# Patient Record
Sex: Female | Born: 1940 | Race: White | Hispanic: No | Smoking: Former smoker
Health system: Southern US, Community
[De-identification: ages and names within clinical notes are randomized; demographics above are authoritative.]

## PROBLEM LIST (undated history)

## (undated) DIAGNOSIS — G4730 Sleep apnea, unspecified: Secondary | ICD-10-CM

## (undated) DIAGNOSIS — Z8489 Family history of other specified conditions: Secondary | ICD-10-CM

## (undated) DIAGNOSIS — J45909 Unspecified asthma, uncomplicated: Secondary | ICD-10-CM

## (undated) DIAGNOSIS — I10 Essential (primary) hypertension: Secondary | ICD-10-CM

## (undated) DIAGNOSIS — E039 Hypothyroidism, unspecified: Secondary | ICD-10-CM

## (undated) HISTORY — PX: APPENDECTOMY: SHX54

## (undated) HISTORY — PX: ABDOMINAL HYSTERECTOMY: SHX81

## (undated) HISTORY — PX: BREAST SURGERY: SHX581

## (undated) HISTORY — PX: CATARACT EXTRACTION, BILATERAL: SHX1313

---

## 2000-05-15 ENCOUNTER — Ambulatory Visit (HOSPITAL_COMMUNITY): Admission: RE | Admit: 2000-05-15 | Discharge: 2000-05-15 | Payer: Self-pay | Admitting: Neurosurgery

## 2000-05-15 ENCOUNTER — Encounter: Payer: Self-pay | Admitting: Neurosurgery

## 2000-06-04 ENCOUNTER — Ambulatory Visit (HOSPITAL_COMMUNITY): Admission: RE | Admit: 2000-06-04 | Discharge: 2000-06-04 | Payer: Self-pay

## 2000-06-04 ENCOUNTER — Encounter: Payer: Self-pay | Admitting: Neurosurgery

## 2000-06-26 ENCOUNTER — Ambulatory Visit (HOSPITAL_COMMUNITY): Admission: RE | Admit: 2000-06-26 | Discharge: 2000-06-26 | Payer: Self-pay | Admitting: Neurosurgery

## 2000-06-26 ENCOUNTER — Encounter: Payer: Self-pay | Admitting: Neurosurgery

## 2009-09-17 ENCOUNTER — Emergency Department: Payer: Self-pay | Admitting: Emergency Medicine

## 2014-01-28 ENCOUNTER — Ambulatory Visit: Payer: Self-pay | Admitting: Ophthalmology

## 2014-01-28 DIAGNOSIS — I10 Essential (primary) hypertension: Secondary | ICD-10-CM

## 2014-01-28 DIAGNOSIS — Z01810 Encounter for preprocedural cardiovascular examination: Secondary | ICD-10-CM

## 2014-01-28 LAB — POTASSIUM: Potassium: 4.2 mmol/L (ref 3.5–5.1)

## 2014-02-09 ENCOUNTER — Ambulatory Visit: Payer: Self-pay | Admitting: Ophthalmology

## 2014-02-19 ENCOUNTER — Ambulatory Visit: Payer: Self-pay | Admitting: Ophthalmology

## 2014-02-19 LAB — POTASSIUM: Potassium: 3.5 mmol/L (ref 3.5–5.1)

## 2014-03-09 ENCOUNTER — Ambulatory Visit: Payer: Self-pay | Admitting: Ophthalmology

## 2015-01-01 NOTE — Op Note (Signed)
PATIENT NAME:  Lydia Frey, Lydia Frey MR#:  [id number] DATE OF BIRTH:  29-Aug-1941  DATE OF PROCEDURE:  03/09/2014  PREOPERATIVE DIAGNOSIS: Visually significant cataract of the left eye.   POSTOPERATIVE DIAGNOSIS: Visually significant cataract of the left eye.   OPERATIVE PROCEDURE: Cataract extraction by phacoemulsification with implant of intraocular lens to left eye.   SURGEON: Galen ManilaWilliam Jaylenne Hamelin, MD.   ANESTHESIA:  1. Managed anesthesia care.  2. Topical tetracaine drops followed by 2% Xylocaine jelly applied in the preoperative holding area.   COMPLICATIONS: None.   TECHNIQUE:  Stop and chop.   DESCRIPTION OF PROCEDURE: The patient was examined and consented in the preoperative holding area where the aforementioned topical anesthesia was applied to the left eye and then brought back to the Operating Room where the left eye was prepped and draped in the usual sterile ophthalmic fashion and a lid speculum was placed. A paracentesis was created with the side port blade and the anterior chamber was filled with viscoelastic. A near clear corneal incision was performed with the steel keratome. A continuous curvilinear capsulorrhexis was performed with a cystotome followed by the capsulorrhexis forceps. Hydrodissection and hydrodelineation were carried out with BSS on a blunt cannula. The lens was removed in a stop and chop technique and the remaining cortical material was removed with the irrigation-aspiration handpiece. The capsular bag was inflated with viscoelastic and the Tecnis ZCB00 21.5-diopter lens, serial number [id number] was placed in the capsular bag without complication. The remaining viscoelastic was removed from the eye with the irrigation-aspiration handpiece. The wounds were hydrated. The anterior chamber was flushed with Miostat and the eye was inflated to physiologic pressure. 0.1 mL of cefuroxime concentration 10 mg/mL was placed in the anterior chamber. The wounds were found to be water  tight. The eye was dressed with Vigamox. The patient was given protective glasses to wear throughout the day and a shield with which to sleep tonight. The patient was also given drops with which to begin a drop regimen today and will follow-up with me in one day.    ___________________________ Jerilee FieldWilliam Frey. Karissa Meenan, MD wlp:lt D: 03/09/2014 14:24:41 ET T: 03/09/2014 21:47:20 ET JOB#: [id number]  cc: Amardeep Beckers Frey. Aryssa Rosamond, MD, <Dictator> Jerilee FieldWILLIAM Frey Shuntavia Yerby MD ELECTRONICALLY SIGNED 03/11/2014 10:48

## 2015-01-01 NOTE — Op Note (Signed)
PATIENT NAME:  Lydia DupesHINSHAW, [name] MR#:  [id number] DATE OF BIRTH:  Mar 19, 1941  DATE OF PROCEDURE:  02/09/2014  PREOPERATIVE DIAGNOSIS: Visually significant cataract of the right eye.   POSTOPERATIVE DIAGNOSIS: Visually significant cataract of the right eye.   OPERATIVE PROCEDURE: Cataract extraction by phacoemulsification with implant of intraocular lens to right eye.   SURGEON: Galen ManilaWilliam Mashanda Ishibashi, MD.   ANESTHESIA:  1.  Managed anesthesia care.  2.  Topical tetracaine drops followed by 2% Xylocaine jelly applied in the preoperative holding area.   COMPLICATIONS: None.   TECHNIQUE:  Stop and chop.  DESCRIPTION OF PROCEDURE: The patient was examined and consented in the preoperative holding area where the aforementioned topical anesthesia was applied to the right eye and then brought back to the Operating Room where the right eye was prepped and draped in the usual sterile ophthalmic fashion and a lid speculum was placed. A paracentesis was created with the side port blade and the anterior chamber was filled with viscoelastic. A near clear corneal incision was performed with the steel keratome. A continuous curvilinear capsulorrhexis was performed with a cystotome followed by the capsulorrhexis forceps. Hydrodissection and hydrodelineation were carried out with BSS on a blunt cannula. The lens was removed in a stop and chop technique and the remaining cortical material was removed with the irrigation-aspiration handpiece. The capsular bag was inflated with viscoelastic and the Tecnis ZCB00 21.5-diopter lens, serial number [id number] was placed in the capsular bag without complication. The remaining viscoelastic was removed from the eye with the irrigation-aspiration handpiece. The wounds were hydrated. The anterior chamber was flushed with Miostat and the eye was inflated to physiologic pressure. 0.1 mL of cefuroxime concentration 10 mg/mL was placed in the anterior chamber. The wounds were found to be  water tight. The eye was dressed with Vigamox. The patient was given protective glasses to wear throughout the day and a shield with which to sleep tonight. The patient was also given drops with which to begin a drop regimen today and will follow-up with me in one day.      ____________________________ Jerilee FieldWilliam L. Ruchi Stoney, MD wlp:dmm D: 02/09/2014 21:24:39 ET T: 02/09/2014 21:36:31 ET JOB#: [id number]  cc: Mitali Shenefield L. Chaden Doom, MD, <Dictator> Jerilee FieldWILLIAM L Jasey Cortez MD ELECTRONICALLY SIGNED 02/10/2014 13:44

## 2016-09-25 ENCOUNTER — Other Ambulatory Visit: Payer: Self-pay | Admitting: Specialist

## 2016-09-25 ENCOUNTER — Ambulatory Visit
Admission: RE | Admit: 2016-09-25 | Discharge: 2016-09-25 | Disposition: A | Discharge status: Home / Self Care | Payer: Medicare Other | Source: Ambulatory Visit | Attending: Specialist | Admitting: Specialist

## 2016-09-25 ENCOUNTER — Other Ambulatory Visit: Payer: Self-pay | Admitting: Sports Medicine

## 2016-09-25 DIAGNOSIS — M7989 Other specified soft tissue disorders: Secondary | ICD-10-CM

## 2016-09-25 DIAGNOSIS — M79605 Pain in left leg: Secondary | ICD-10-CM | POA: Diagnosis not present

## 2017-01-31 ENCOUNTER — Other Ambulatory Visit: Payer: Self-pay | Admitting: Specialist

## 2017-02-11 ENCOUNTER — Other Ambulatory Visit: Payer: Self-pay | Admitting: Specialist

## 2017-02-11 ENCOUNTER — Encounter
Admission: RE | Admit: 2017-02-11 | Discharge: 2017-02-11 | Disposition: A | Discharge status: Home / Self Care | Payer: Medicare Other | Source: Ambulatory Visit | Attending: Specialist | Admitting: Specialist

## 2017-02-11 DIAGNOSIS — I10 Essential (primary) hypertension: Secondary | ICD-10-CM | POA: Insufficient documentation

## 2017-02-11 DIAGNOSIS — Z01810 Encounter for preprocedural cardiovascular examination: Secondary | ICD-10-CM | POA: Diagnosis not present

## 2017-02-11 DIAGNOSIS — I4510 Unspecified right bundle-branch block: Secondary | ICD-10-CM | POA: Insufficient documentation

## 2017-02-11 HISTORY — DX: Essential (primary) hypertension: I10

## 2017-02-11 HISTORY — DX: Unspecified asthma, uncomplicated: J45.909

## 2017-02-11 HISTORY — DX: Hypothyroidism, unspecified: E03.9

## 2017-02-11 HISTORY — DX: Family history of other specified conditions: Z84.89

## 2017-02-11 HISTORY — DX: Sleep apnea, unspecified: G47.30

## 2017-02-11 NOTE — Pre-Procedure Instructions (Signed)
[facility]

## 2017-02-11 NOTE — Patient Instructions (Signed)
Your procedure is scheduled on: Monday 02/18/17 Report to DAY SURGERY. 2ND FLOOR MEDICAL MALL ENTRANCE. To find out your arrival time please call [phone] between 1PM - 3PM on Friday 02/15/17.  Remember: Instructions that are not followed completely may result in serious medical risk, up to and including death, or upon the discretion of your surgeon and anesthesiologist your surgery may need to be rescheduled.    __X__ 1. Do not eat food or drink liquids after midnight. No gum chewing or hard candies.     __X__ 2. No Alcohol for 24 hours before or after surgery.   ____ 3. Bring all medications with you on the day of surgery if instructed.    __X__ 4. Notify your doctor if there is any change in your medical condition     (cold, fever, infections).             ___X__5. No smoking within 24 hours of your surgery.     Do not wear jewelry, make-up, hairpins, clips or nail polish.  Do not wear lotions, powders, or perfumes.   Do not shave 48 hours prior to surgery. Men may shave face and neck.  Do not bring valuables to the hospital.    [facility] belongings or valuables.               Contacts, dentures or bridgework may not be worn into surgery.  Leave your suitcase in the car. After surgery it may be brought to your room.  For patients admitted to the hospital, discharge time is determined by your                treatment team.   Patients discharged the day of surgery will not be allowed to drive home.   Please read over the following fact sheets that you were given:   MRSA Information   __X__ Take these medicines the morning of surgery with A SIP OF WATER:    1. AMLODIPINE  2. LEVOTHYROXINE  3. LOSARTAN  4. METOPROLOL  5.  6.  ____ Fleet Enema (as directed)   __X__ Use CHG Soap as directed  __X__ Use YOUR NEBULIZER MORNING OF SURGERY AND BRING RESCUE INHALER  ____ Stop metformin 2 days prior to surgery    ____ Take 1/2 of usual insulin  dose the night before surgery and none on the morning of surgery.   ____ Stop Coumadin/Plavix/aspirin on   __X__ Stop Anti-inflammatories such as Advil, Aleve, Ibuprofen, Motrin, Naproxen, Naprosyn, Goodies,powder, or aspirin products.  OK to take Tylenol. STOP MELOXICAM   ____ Stop supplements until after surgery.    __X__ Bring C-Pap to the hospital.

## 2017-02-11 NOTE — Pre-Procedure Instructions (Signed)
Dr Penwarden reviewed EKG. No new orders. 

## 2017-02-18 ENCOUNTER — Ambulatory Visit: Payer: Medicare Other | Admitting: Anesthesiology

## 2017-02-18 ENCOUNTER — Encounter
Admission: RE | Disposition: A | Discharge status: Home / Self Care | Payer: Self-pay | Source: Ambulatory Visit | Attending: Specialist

## 2017-02-18 ENCOUNTER — Encounter: Payer: Self-pay | Admitting: *Deleted

## 2017-02-18 ENCOUNTER — Ambulatory Visit
Admission: RE | Admit: 2017-02-18 | Discharge: 2017-02-18 | Disposition: A | Discharge status: Home / Self Care | Payer: Medicare Other | Source: Ambulatory Visit | Attending: Specialist | Admitting: Specialist

## 2017-02-18 DIAGNOSIS — Z888 Allergy status to other drugs, medicaments and biological substances status: Secondary | ICD-10-CM | POA: Diagnosis not present

## 2017-02-18 DIAGNOSIS — E039 Hypothyroidism, unspecified: Secondary | ICD-10-CM | POA: Insufficient documentation

## 2017-02-18 DIAGNOSIS — G5601 Carpal tunnel syndrome, right upper limb: Secondary | ICD-10-CM | POA: Insufficient documentation

## 2017-02-18 DIAGNOSIS — I10 Essential (primary) hypertension: Secondary | ICD-10-CM | POA: Diagnosis not present

## 2017-02-18 DIAGNOSIS — Z9989 Dependence on other enabling machines and devices: Secondary | ICD-10-CM | POA: Insufficient documentation

## 2017-02-18 DIAGNOSIS — Z882 Allergy status to sulfonamides status: Secondary | ICD-10-CM | POA: Diagnosis not present

## 2017-02-18 DIAGNOSIS — Z87891 Personal history of nicotine dependence: Secondary | ICD-10-CM | POA: Diagnosis not present

## 2017-02-18 DIAGNOSIS — J45909 Unspecified asthma, uncomplicated: Secondary | ICD-10-CM | POA: Insufficient documentation

## 2017-02-18 DIAGNOSIS — Z79899 Other long term (current) drug therapy: Secondary | ICD-10-CM | POA: Insufficient documentation

## 2017-02-18 DIAGNOSIS — G4730 Sleep apnea, unspecified: Secondary | ICD-10-CM | POA: Diagnosis not present

## 2017-02-18 DIAGNOSIS — E669 Obesity, unspecified: Secondary | ICD-10-CM | POA: Insufficient documentation

## 2017-02-18 DIAGNOSIS — M65331 Trigger finger, right middle finger: Secondary | ICD-10-CM | POA: Insufficient documentation

## 2017-02-18 DIAGNOSIS — Z6839 Body mass index (BMI) 39.0-39.9, adult: Secondary | ICD-10-CM | POA: Diagnosis not present

## 2017-02-18 HISTORY — PX: TRIGGER FINGER RELEASE: SHX641

## 2017-02-18 HISTORY — PX: CARPAL TUNNEL RELEASE: SHX101

## 2017-02-18 SURGERY — CARPAL TUNNEL RELEASE
Anesthesia: General | Laterality: Right | Wound class: Clean

## 2017-02-18 MED ORDER — EPHEDRINE SULFATE 50 MG/ML IJ SOLN
INTRAMUSCULAR | Status: DC | PRN
  Administered 2017-02-18: 10 mg via INTRAVENOUS
  Administered 2017-02-18: 5 mg via INTRAVENOUS
  Administered 2017-02-18: 10 mg via INTRAVENOUS

## 2017-02-18 MED ORDER — FENTANYL CITRATE (PF) 100 MCG/2ML IJ SOLN: 25.0000 ug | INTRAMUSCULAR | Status: DC | PRN

## 2017-02-18 MED ORDER — OXYCODONE HCL 5 MG PO TABS: 5.0000 mg | ORAL_TABLET | Freq: Once | ORAL | Status: DC | PRN

## 2017-02-18 MED ORDER — PROPOFOL 10 MG/ML IV BOLUS
INTRAVENOUS | Status: DC | PRN
  Administered 2017-02-18: 200 mg via INTRAVENOUS
  Administered 2017-02-18: 100 mg via INTRAVENOUS

## 2017-02-18 MED ORDER — LACTATED RINGERS IV SOLN
INTRAVENOUS | Status: DC
  Administered 2017-02-18 (×2): via INTRAVENOUS

## 2017-02-18 MED ORDER — IPRATROPIUM-ALBUTEROL 0.5-2.5 (3) MG/3ML IN SOLN
RESPIRATORY_TRACT | Status: AC
  Filled 2017-02-18: qty 3

## 2017-02-18 MED ORDER — ACETAMINOPHEN 10 MG/ML IV SOLN
INTRAVENOUS | Status: DC | PRN
  Administered 2017-02-18: 1000 mg via INTRAVENOUS

## 2017-02-18 MED ORDER — DEXAMETHASONE SODIUM PHOSPHATE 10 MG/ML IJ SOLN
INTRAMUSCULAR | Status: AC
  Filled 2017-02-18: qty 1

## 2017-02-18 MED ORDER — GABAPENTIN 400 MG PO CAPS: 400.0000 mg | ORAL_CAPSULE | Freq: Two times a day (BID) | ORAL | 3 refills | Status: AC

## 2017-02-18 MED ORDER — FAMOTIDINE 20 MG PO TABS
20.0000 mg | ORAL_TABLET | Freq: Once | ORAL | Status: AC
  Administered 2017-02-18: 20 mg via ORAL

## 2017-02-18 MED ORDER — PROPOFOL 10 MG/ML IV BOLUS
INTRAVENOUS | Status: AC
  Filled 2017-02-18: qty 20

## 2017-02-18 MED ORDER — TRAMADOL HCL 50 MG PO TABS: 50.0000 mg | ORAL_TABLET | Freq: Four times a day (QID) | ORAL | 3 refills | Status: AC | PRN

## 2017-02-18 MED ORDER — IPRATROPIUM-ALBUTEROL 0.5-2.5 (3) MG/3ML IN SOLN
3.0000 mL | Freq: Once | RESPIRATORY_TRACT | Status: AC
  Administered 2017-02-18: 3 mL via RESPIRATORY_TRACT

## 2017-02-18 MED ORDER — FENTANYL CITRATE (PF) 100 MCG/2ML IJ SOLN
INTRAMUSCULAR | Status: AC
  Filled 2017-02-18: qty 2

## 2017-02-18 MED ORDER — ACETAMINOPHEN 10 MG/ML IV SOLN
INTRAVENOUS | Status: AC
  Filled 2017-02-18: qty 100

## 2017-02-18 MED ORDER — SUCCINYLCHOLINE CHLORIDE 20 MG/ML IJ SOLN
INTRAMUSCULAR | Status: DC | PRN
  Administered 2017-02-18: 100 mg via INTRAVENOUS

## 2017-02-18 MED ORDER — DEXAMETHASONE SODIUM PHOSPHATE 10 MG/ML IJ SOLN
INTRAMUSCULAR | Status: DC | PRN
  Administered 2017-02-18: 10 mg via INTRAVENOUS

## 2017-02-18 MED ORDER — BUPIVACAINE HCL (PF) 0.5 % IJ SOLN
INTRAMUSCULAR | Status: AC
  Filled 2017-02-18: qty 30

## 2017-02-18 MED ORDER — ONDANSETRON HCL 4 MG/2ML IJ SOLN
INTRAMUSCULAR | Status: AC
  Filled 2017-02-18: qty 2

## 2017-02-18 MED ORDER — LIDOCAINE HCL (PF) 2 % IJ SOLN
INTRAMUSCULAR | Status: AC
  Filled 2017-02-18: qty 2

## 2017-02-18 MED ORDER — CEFAZOLIN SODIUM-DEXTROSE 2-4 GM/100ML-% IV SOLN
2.0000 g | INTRAVENOUS | Status: AC
  Administered 2017-02-18: 2 g via INTRAVENOUS

## 2017-02-18 MED ORDER — IPRATROPIUM-ALBUTEROL 0.5-2.5 (3) MG/3ML IN SOLN
3.0000 mL | RESPIRATORY_TRACT | Status: DC
  Administered 2017-02-18: 3 mL via RESPIRATORY_TRACT

## 2017-02-18 MED ORDER — GABAPENTIN 300 MG PO CAPS
ORAL_CAPSULE | ORAL | Status: AC
  Filled 2017-02-18: qty 1

## 2017-02-18 MED ORDER — SUCCINYLCHOLINE CHLORIDE 20 MG/ML IJ SOLN
INTRAMUSCULAR | Status: AC
  Filled 2017-02-18: qty 1

## 2017-02-18 MED ORDER — MELOXICAM 7.5 MG PO TABS
ORAL_TABLET | ORAL | Status: AC
  Filled 2017-02-18: qty 2

## 2017-02-18 MED ORDER — MELOXICAM 7.5 MG PO TABS
15.0000 mg | ORAL_TABLET | Freq: Once | ORAL | Status: AC
Start: 2017-02-18 — End: 2017-02-18
  Administered 2017-02-18: 15 mg via ORAL

## 2017-02-18 MED ORDER — MEPERIDINE HCL 50 MG/ML IJ SOLN: 6.2500 mg | INTRAMUSCULAR | Status: DC | PRN

## 2017-02-18 MED ORDER — GLYCOPYRROLATE 0.2 MG/ML IJ SOLN
INTRAMUSCULAR | Status: AC
  Filled 2017-02-18: qty 1

## 2017-02-18 MED ORDER — CHLORHEXIDINE GLUCONATE CLOTH 2 % EX PADS: 6.0000 | MEDICATED_PAD | Freq: Once | CUTANEOUS | Status: DC

## 2017-02-18 MED ORDER — OXYCODONE HCL 5 MG/5ML PO SOLN: 5.0000 mg | Freq: Once | ORAL | Status: DC | PRN

## 2017-02-18 MED ORDER — BUPIVACAINE HCL 0.5 % IJ SOLN
INTRAMUSCULAR | Status: DC | PRN
  Administered 2017-02-18: 25 mL

## 2017-02-18 MED ORDER — FAMOTIDINE 20 MG PO TABS
ORAL_TABLET | ORAL | Status: AC
  Filled 2017-02-18: qty 1

## 2017-02-18 MED ORDER — GLYCOPYRROLATE 0.2 MG/ML IJ SOLN
INTRAMUSCULAR | Status: DC | PRN
  Administered 2017-02-18: 0.2 mg via INTRAVENOUS

## 2017-02-18 MED ORDER — FENTANYL CITRATE (PF) 100 MCG/2ML IJ SOLN
INTRAMUSCULAR | Status: DC | PRN
  Administered 2017-02-18 (×2): 50 ug via INTRAVENOUS

## 2017-02-18 MED ORDER — GABAPENTIN 300 MG PO CAPS
300.0000 mg | ORAL_CAPSULE | ORAL | Status: AC
  Administered 2017-02-18: 300 mg via ORAL

## 2017-02-18 MED ORDER — MELOXICAM 15 MG PO TABS: 15.0000 mg | ORAL_TABLET | Freq: Every day | ORAL | 3 refills | Status: AC

## 2017-02-18 MED ORDER — IPRATROPIUM-ALBUTEROL 0.5-2.5 (3) MG/3ML IN SOLN: 3.0000 mL | Freq: Four times a day (QID) | RESPIRATORY_TRACT | Status: DC

## 2017-02-18 MED ORDER — IPRATROPIUM-ALBUTEROL 0.5-2.5 (3) MG/3ML IN SOLN
RESPIRATORY_TRACT | Status: AC
Start: 2017-02-18 — End: 2017-02-18
  Administered 2017-02-18: 3 mL via RESPIRATORY_TRACT
  Filled 2017-02-18: qty 3

## 2017-02-18 MED ORDER — LIDOCAINE HCL (CARDIAC) 20 MG/ML IV SOLN
INTRAVENOUS | Status: DC | PRN
  Administered 2017-02-18: 100 mg via INTRAVENOUS

## 2017-02-18 MED ORDER — PROMETHAZINE HCL 25 MG/ML IJ SOLN: 6.2500 mg | INTRAMUSCULAR | Status: DC | PRN

## 2017-02-18 MED ORDER — ONDANSETRON HCL 4 MG/2ML IJ SOLN
INTRAMUSCULAR | Status: DC | PRN
  Administered 2017-02-18: 4 mg via INTRAVENOUS

## 2017-02-18 MED ORDER — CEFAZOLIN SODIUM-DEXTROSE 2-4 GM/100ML-% IV SOLN
INTRAVENOUS | Status: AC
  Filled 2017-02-18: qty 100

## 2017-02-18 SURGICAL SUPPLY — 34 items
BLADE SURG MINI STRL (BLADE) ×3 IMPLANT
BNDG ESMARK 4X12 TAN STRL LF (GAUZE/BANDAGES/DRESSINGS) ×3 IMPLANT
CANISTER SUCT 1200ML W/VALVE (MISCELLANEOUS) ×3 IMPLANT
CHLORAPREP W/TINT 26ML (MISCELLANEOUS) ×3 IMPLANT
CUFF TOURN 18 STER (MISCELLANEOUS) IMPLANT
ELECT REM PT RETURN 9FT ADLT (ELECTROSURGICAL) ×3
ELECTRODE REM PT RTRN 9FT ADLT (ELECTROSURGICAL) ×1 IMPLANT
GAUZE FLUFF 18X24 1PLY STRL (GAUZE/BANDAGES/DRESSINGS) ×3 IMPLANT
GAUZE PETRO XEROFOAM 1X8 (MISCELLANEOUS) ×3 IMPLANT
GLOVE BIO SURGEON STRL SZ8 (GLOVE) ×3 IMPLANT
GLOVE BIOGEL PI IND STRL 7.0 (GLOVE) IMPLANT
GLOVE BIOGEL PI INDICATOR 7.0 (GLOVE) ×2
GLOVE PROTEXIS LATEX SZ 7.5 (GLOVE) ×3 IMPLANT
GLOVE SURG LATEX 7.5 PF (GLOVE) IMPLANT
GOWN STRL REUS W/ TWL LRG LVL3 (GOWN DISPOSABLE) ×1 IMPLANT
GOWN STRL REUS W/TWL LRG LVL3 (GOWN DISPOSABLE) ×3
GOWN STRL REUS W/TWL LRG LVL4 (GOWN DISPOSABLE) ×3 IMPLANT
KIT RM TURNOVER STRD PROC AR (KITS) ×3 IMPLANT
NS IRRIG 500ML POUR BTL (IV SOLUTION) ×3 IMPLANT
PACK EXTREMITY ARMC (MISCELLANEOUS) ×3 IMPLANT
PAD PREP 24X41 OB/GYN DISP (PERSONAL CARE ITEMS) ×3 IMPLANT
PADDING CAST 4IN STRL (MISCELLANEOUS) ×2
PADDING CAST BLEND 4X4 STRL (MISCELLANEOUS) ×1 IMPLANT
SPLINT CAST 1 STEP 3X12 (MISCELLANEOUS) ×3 IMPLANT
STOCKINETTE 48X4 2 PLY STRL (GAUZE/BANDAGES/DRESSINGS) ×1 IMPLANT
STOCKINETTE BIAS CUT 4 980044 (GAUZE/BANDAGES/DRESSINGS) ×3 IMPLANT
STOCKINETTE STRL 4IN 9604848 (GAUZE/BANDAGES/DRESSINGS) ×3 IMPLANT
SUT ETHILON 4-0 (SUTURE) ×3
SUT ETHILON 4-0 FS2 18XMFL BLK (SUTURE) ×1
SUT ETHILON 5-0 (SUTURE) ×3
SUT ETHILON 5-0 C-3 18XMFL BLK (SUTURE) ×1
SUT ETHILON 5-0 FS-2 18 BLK (SUTURE) ×3 IMPLANT
SUTURE ETHLN 4-0 FS2 18XMF BLK (SUTURE) ×1 IMPLANT
SUTURE ETHLN 5-0 C3 18XMF BLK (SUTURE) ×1 IMPLANT

## 2017-02-18 NOTE — Anesthesia Post-op Follow-up Note (Cosign Needed)
Anesthesia QCDR form completed.        

## 2017-02-18 NOTE — Anesthesia Preprocedure Evaluation (Signed)
Anesthesia Evaluation  Patient identified by MRN, date of birth, ID band Patient awake    Reviewed: Allergy & Precautions, NPO status , Patient's Chart, lab work & pertinent test results  History of Anesthesia Complications Negative for: history of anesthetic complications  Airway Mallampati: II  TM Distance: >3 FB Neck ROM: Full    Dental  (+) Missing, Poor Dentition   Pulmonary asthma , sleep apnea and Continuous Positive Airway Pressure Ventilation , former smoker,    breath sounds clear to auscultation- rhonchi (-) wheezing      Cardiovascular hypertension, Pt. on medications (-) CAD and (-) Past MI  Rhythm:Regular Rate:Normal - Systolic murmurs and - Diastolic murmurs    Neuro/Psych negative neurological ROS  negative psych ROS   GI/Hepatic negative GI ROS, Neg liver ROS,   Endo/Other  neg diabetesHypothyroidism   Renal/GU negative Renal ROS     Musculoskeletal negative musculoskeletal ROS (+)   Abdominal (+) + obese,   Peds  Hematology negative hematology ROS (+)   Anesthesia Other Findings Past Medical History: No date: Asthma No date: Family history of adverse reaction to anesthes*     Comment: mother unknown reaction No date: Hypertension No date: Hypothyroidism No date: Sleep apnea   Reproductive/Obstetrics                             Anesthesia Physical Anesthesia Plan  ASA: III  Anesthesia Plan: General   Post-op Pain Management:    Induction: Intravenous  PONV Risk Score and Plan: 2 and Ondansetron and Dexamethasone  Airway Management Planned: LMA  Additional Equipment:   Intra-op Plan:   Post-operative Plan:   Informed Consent: I have reviewed the patients History and Physical, chart, labs and discussed the procedure including the risks, benefits and alternatives for the proposed anesthesia with the patient or authorized representative who has indicated  his/her understanding and acceptance.   Dental advisory given  Plan Discussed with: CRNA and Anesthesiologist  Anesthesia Plan Comments:         Anesthesia Quick Evaluation

## 2017-02-18 NOTE — H&P (Signed)
THE PATIENT WAS SEEN PRIOR TO SURGERY TODAY.  HISTORY, ALLERGIES, HOME MEDICATIONS AND OPERATIVE PROCEDURE WERE REVIEWED. RISKS AND BENEFITS OF SURGERY DISCUSSED WITH PATIENT AGAIN.  NO CHANGES FROM INITIAL HISTORY AND PHYSICAL NOTED.    

## 2017-02-18 NOTE — Anesthesia Procedure Notes (Signed)
Procedure Name: Intubation Date/Time: 02/18/2017 1:15 PM Performed by: Aline Brochure Pre-anesthesia Checklist: Patient identified, Emergency Drugs available, Suction available and Patient being monitored Patient Re-evaluated:Patient Re-evaluated prior to inductionOxygen Delivery Method: Circle system utilized Preoxygenation: Pre-oxygenation with 100% oxygen Intubation Type: IV induction Ventilation: Oral airway inserted - appropriate to patient size and Two handed mask ventilation required Laryngoscope Size: Mac and 3 Grade View: Grade I Tube type: Oral Tube size: 7.0 mm Number of attempts: 1 Airway Equipment and Method: Stylet Placement Confirmation: ETT inserted through vocal cords under direct vision,  positive ETCO2 and breath sounds checked- equal and bilateral Secured at: 21 cm Tube secured with: Tape Dental Injury: Teeth and Oropharynx as per pre-operative assessment

## 2017-02-18 NOTE — Transfer of Care (Signed)
Immediate Anesthesia Transfer of Care Note  Patient: Lydia Frey  Procedure(s) Performed: Procedure(s): CARPAL TUNNEL RELEASE (Right) RELEASE TRIGGER FINGER/A-1 PULLEY (Right)  Patient Location: PACU  Anesthesia Type:General  Level of Consciousness: awake  Airway & Oxygen Therapy: Patient connected to face mask oxygen  Post-op Assessment: Post -op Vital signs reviewed and stable  Post vital signs: stable  Last Vitals:  Vitals:   02/18/17 1156  BP: (!) 159/76  Pulse: (!) 59  Resp: 18  Temp: (!) 35.8 C    Last Pain:  Vitals:   02/18/17 1156  TempSrc: Tympanic         Complications: No apparent anesthesia complications

## 2017-02-18 NOTE — Op Note (Signed)
02/18/2017  2:09 PM  PATIENT:  Lydia Frey    PRE-OPERATIVE DIAGNOSIS: RIGHT CARPAL TUNNEL SYNDROME/ TRIGGERING MIDDLE FINGER   POST-OPERATIVE DIAGNOSIS: RIGHT CARPAL TUNNEL SYNDROME/ TRIGGERING MIDDLE FINGER  PROCEDURE:  RIGHT CARPAL TUNNEL RELEASE 2) RELEASE RIGHT MIDDLE FINGER A-1 PULLEY  SURGEON: Yashira Offenberger E Aaleeyah Bias, MD    ANESTHESIA:   General  TOURNIQUET TIME: 29   MIN  PREOPERATIVE INDICATIONS:  Lydia DupesDoris L Tonne is a  76 y.o. female with a diagnosis of right carpal tunnel syndrome who failed conservative measures and elected for surgical management.    The risks benefits and alternatives were discussed with the patient preoperatively including but not limited to the risks of infection, bleeding, nerve injury, incomplete relief of symptoms, pillar pain, cardiopulmonary complications, the need for revision surgery, among others, and the patient was willing to proceed.  OPERATIVE FINDINGS: Thickened volar ligament and nerve compression.  OPERATIVE PROCEDURE: The patient is brought to the operating room placed in the supine position. General anesthesia was administered. The right upper extremity was prepped and draped in usual sterile fashion. Time out was performed. The arm was elevated and exsanguinated and the tourniquet was inflated.  A transverse incision was made over the middle finger A-1 pulley and dissection carried out in the midline down to the pulley.  Spring retractors were inserted and the pulley was excised completely. There was increased synovitis which was removed.  The sheath was released proximally and distally. Movement was good. After irrigation the skin was closed with 4-0 Vicryl. A proximal carpal tunnel  incision was then made in line with the radial border of the ring finger. The carpal tunnel transverse fascia was identified, cleaned, and incised sharply. The common sensory branches were visualized along with the superficial palmar arch and protected.  The median  nerve was protected below  A Kelly clamp was  placed underneath the transverse carpal ligament, protecting the nerve. I released the ligament completely, and then released the proximal distal volar forearm fascia. The nerve was identified, and visualized, and protected throughout the case. The motor branch was intact upon inspection.  No masses or abnormalities were identified in ulnar bursa.  The wounds were irrigated copiously, and the wounds injected with 1/2% sensorcaine, and the skin closed with nylon followed by a volar splint and sterile gauze.  Tourniquet was deflated with good return of blood flow to all fingers. Sponge and needle counts were correct.  The patient tolerated this well, with no complications. The patient was awakened and taken to recovery in good condition.

## 2017-02-18 NOTE — Discharge Instructions (Signed)

## 2017-02-19 ENCOUNTER — Encounter: Payer: Self-pay | Admitting: Specialist

## 2017-02-19 NOTE — Anesthesia Postprocedure Evaluation (Signed)
Anesthesia Post Note  Patient: Lydia Frey  Procedure(s) Performed: Procedure(s) (LRB): CARPAL TUNNEL RELEASE (Right) RELEASE TRIGGER FINGER/A-1 PULLEY (Right)  Patient location during evaluation: PACU Anesthesia Type: General Level of consciousness: awake and alert and oriented Pain management: pain level controlled Vital Signs Assessment: post-procedure vital signs reviewed and stable Respiratory status: spontaneous breathing, nonlabored ventilation and respiratory function stable Cardiovascular status: blood pressure returned to baseline and stable Postop Assessment: no signs of nausea or vomiting Anesthetic complications: no     Last Vitals:  Vitals:   02/18/17 1556 02/18/17 1615  BP: (!) 130/48   Pulse:  (P) 67  Resp:  (P) 16  Temp:      Last Pain:  Vitals:   02/18/17 1156  TempSrc: Tympanic                 Jazz Biddy

## 2018-01-23 IMAGING — US US EXTREM LOW VENOUS*L*
1 series · 14 of 24 positions shown · non-contrast
Comparison: None.

CLINICAL DATA: Left lower extremity pain.

EXAM:
LEFT LOWER EXTREMITY VENOUS DUPLEX ULTRASOUND
TECHNIQUE: Doppler venous assessment of the left lower extremity deep venous
system was performed, including characterization of spectral flow,
compressibility, and phasicity.

[Series 1: us extrem low venous*left* · 0.08mm/px · 14 of 33 slices shown]
[im 1/33]
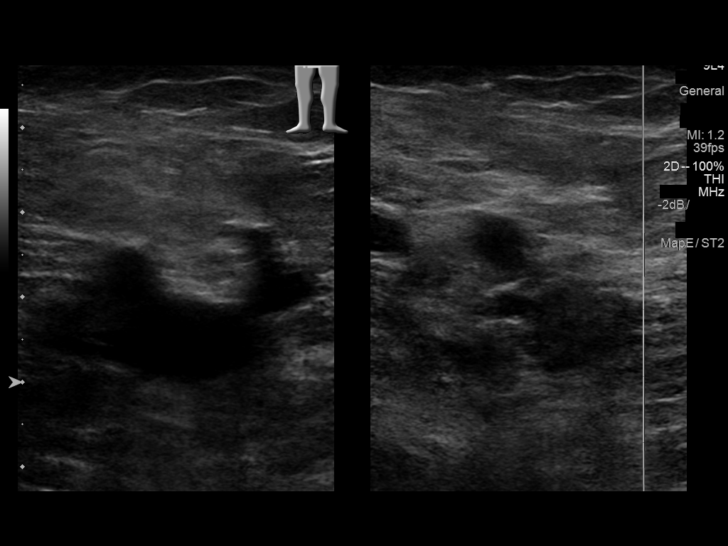
[im 3/33]
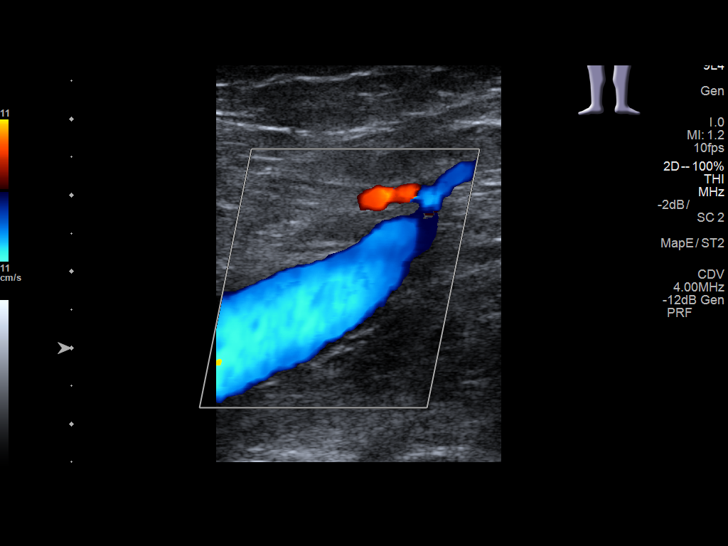
[im 6/33]
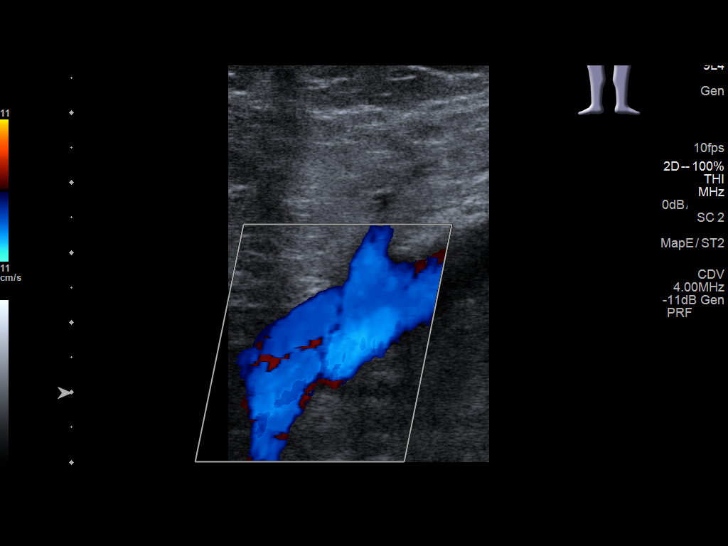
[im 9/33]
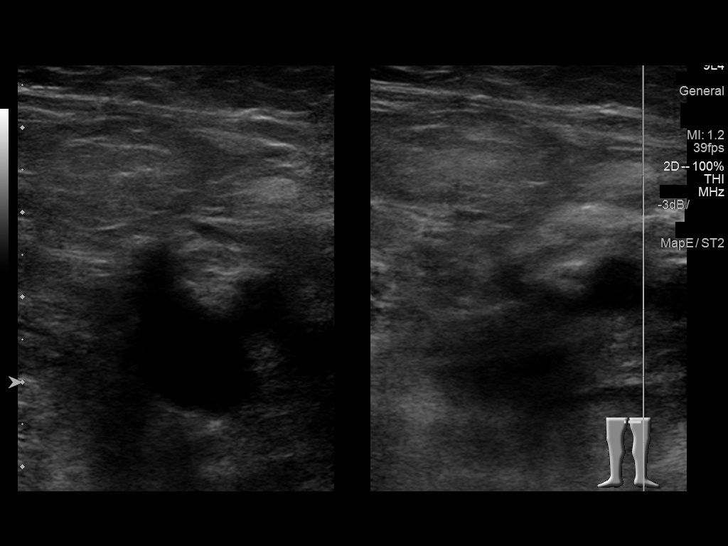
[im 10/33]
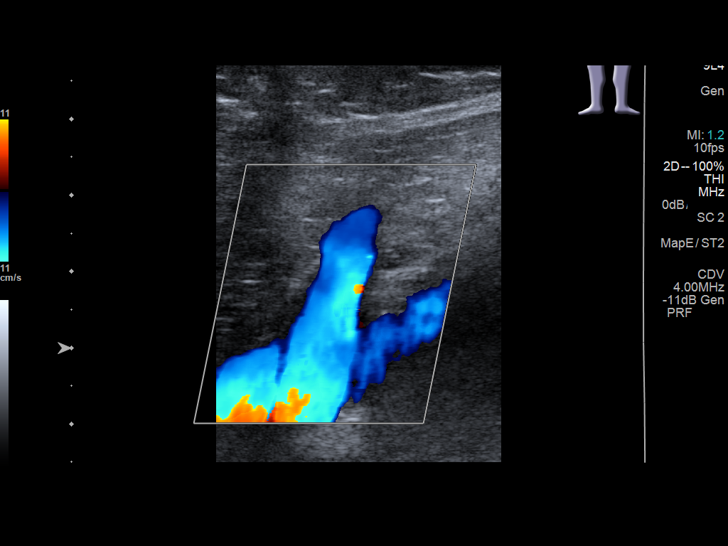
[im 13/33]
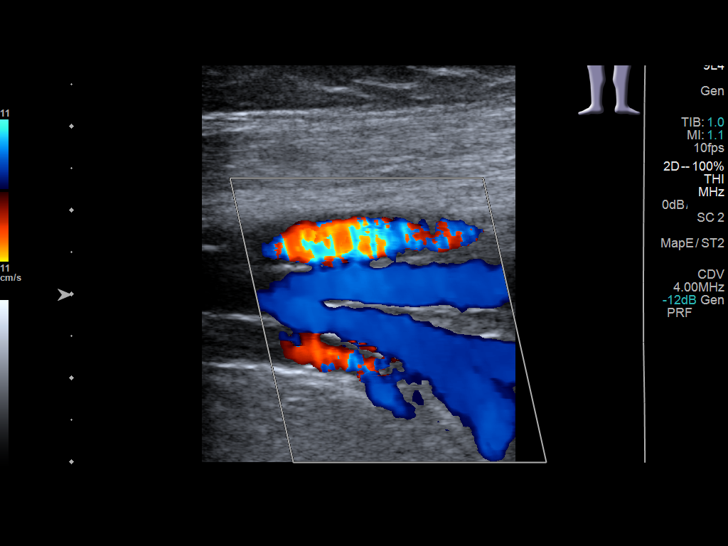
[im 16/33]
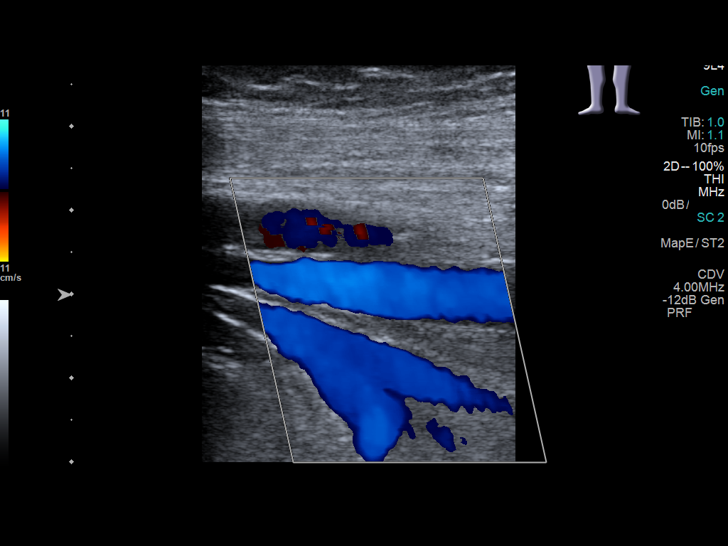
[im 17/33]
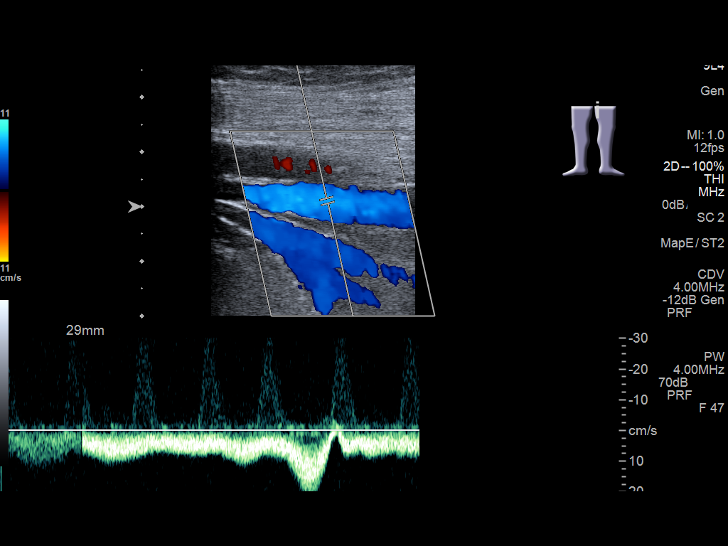
[im 20/33]
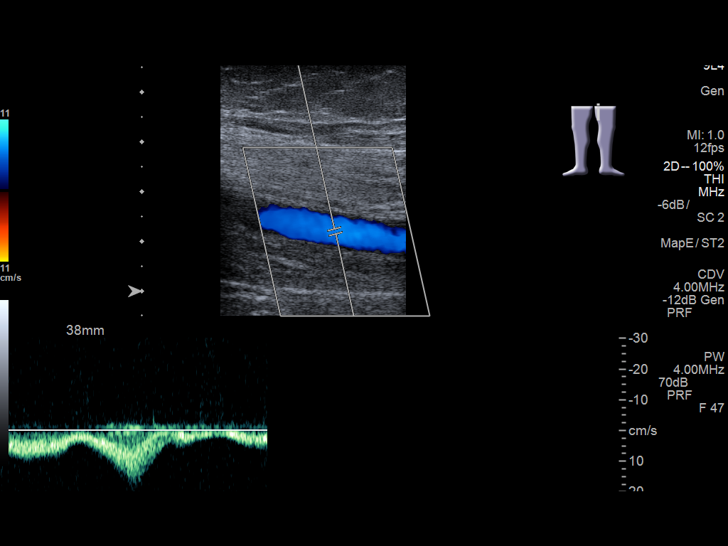
[im 23/33]
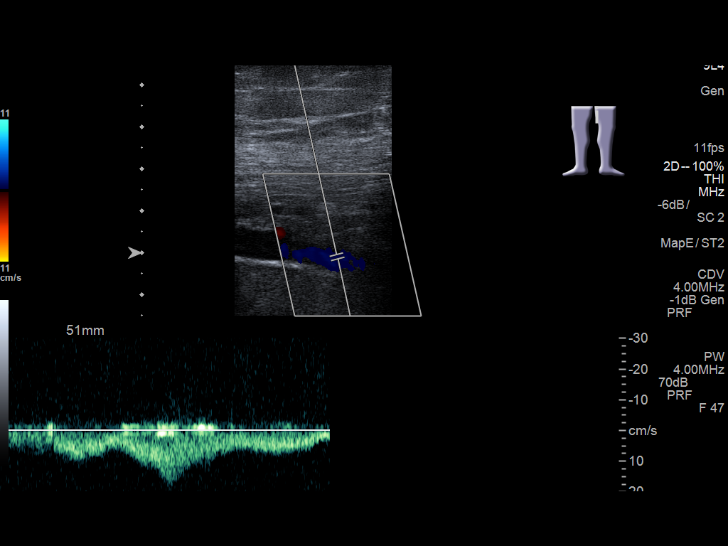
[im 26/33]
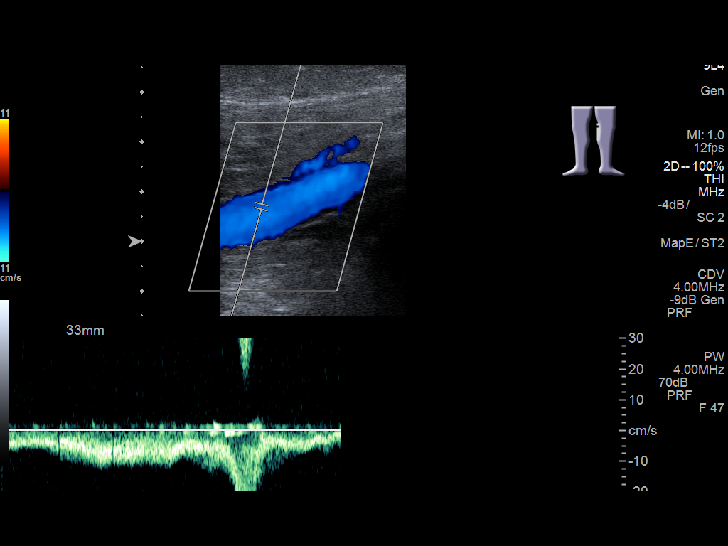
[im 27/33]
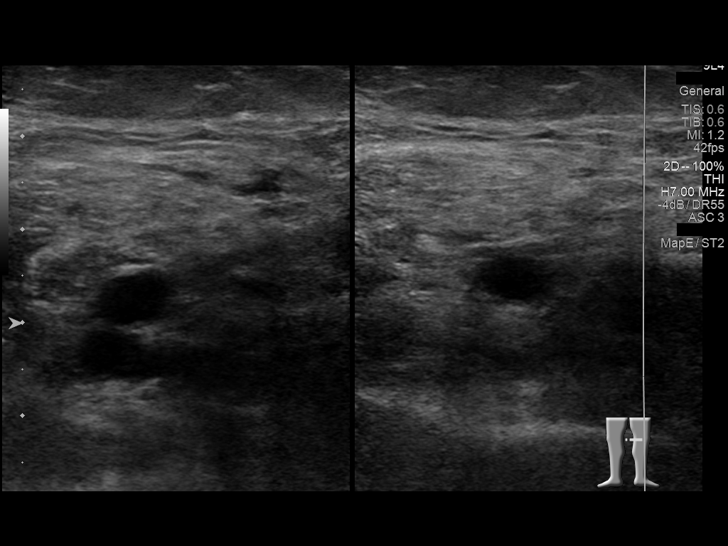
[im 30/33]
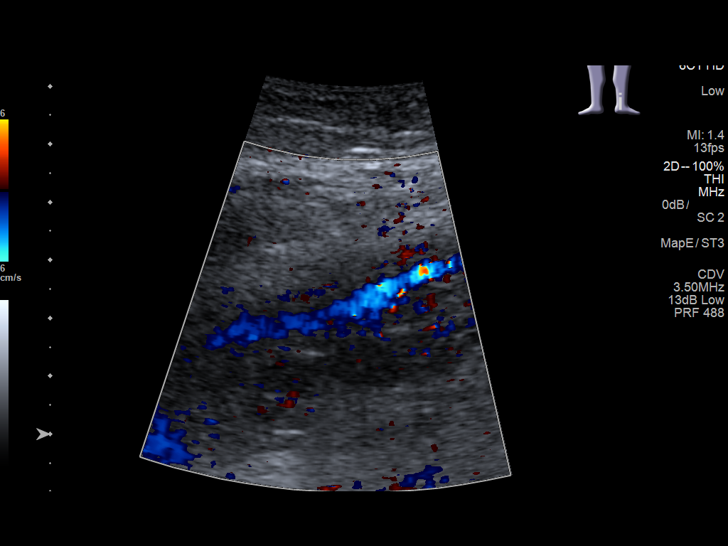
[im 33/33]
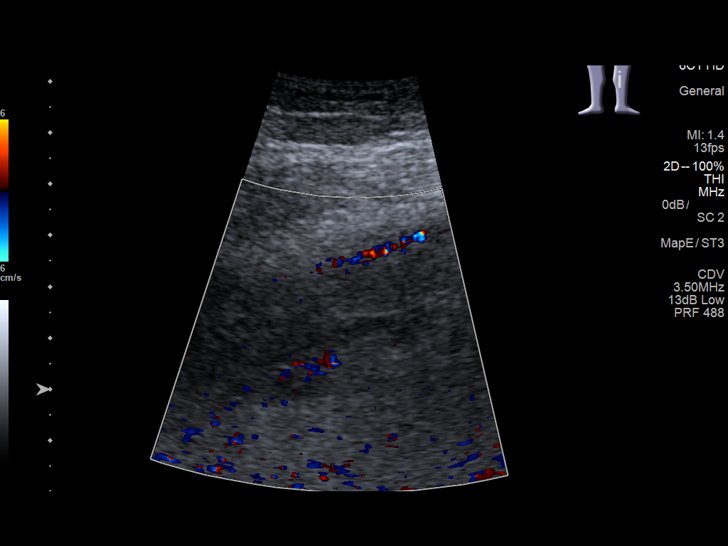

[14 of 24 positions shown; findings below may reference images not displayed]

FINDINGS: There is complete compressibility of the left common femoral,
femoral, and popliteal veins. Doppler analysis demonstrates
respiratory phasicity and augmentation of flow with calf
compression. No obvious superficial vein or calf vein thrombosis.
IMPRESSION: No evidence of left lower extremity DVT.

## 2024-06-01 ENCOUNTER — Other Ambulatory Visit: Payer: Self-pay | Admitting: Ophthalmology

## 2024-06-01 DIAGNOSIS — H5340 Unspecified visual field defects: Secondary | ICD-10-CM

## 2024-06-12 ENCOUNTER — Ambulatory Visit
Admission: RE | Admit: 2024-06-12 | Discharge: 2024-06-12 | Disposition: A | Discharge status: Home / Self Care | Source: Ambulatory Visit | Attending: Ophthalmology | Admitting: Ophthalmology

## 2024-06-12 DIAGNOSIS — H5340 Unspecified visual field defects: Secondary | ICD-10-CM

## 2024-06-12 MED ORDER — GADOPICLENOL 0.5 MMOL/ML IV SOLN
10.0000 mL | Freq: Once | INTRAVENOUS | Status: AC | PRN
  Administered 2024-06-12: 10 mL via INTRAVENOUS
